# Patient Record
Sex: Female | Born: 1995 | Race: White | Hispanic: No | Marital: Single | Smoking: Never smoker
Health system: Southern US, Community
[De-identification: ages and names within clinical notes are randomized; demographics above are authoritative.]

## PROBLEM LIST (undated history)

## (undated) DIAGNOSIS — F909 Attention-deficit hyperactivity disorder, unspecified type: Secondary | ICD-10-CM

## (undated) HISTORY — PX: TONSILLECTOMY: SUR1361

---
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
## 2018-09-08 ENCOUNTER — Encounter: Payer: Self-pay | Admitting: Neurology

## 2018-09-08 ENCOUNTER — Ambulatory Visit (INDEPENDENT_AMBULATORY_CARE_PROVIDER_SITE_OTHER): Payer: BC Managed Care – PPO | Admitting: Neurology

## 2018-09-08 ENCOUNTER — Other Ambulatory Visit: Payer: Self-pay

## 2018-09-08 VITALS — BP 114/72 | HR 65 | Ht 62.0 in | Wt 126.0 lb

## 2018-09-08 DIAGNOSIS — R251 Tremor, unspecified: Secondary | ICD-10-CM | POA: Diagnosis not present

## 2018-09-08 NOTE — Progress Notes (Signed)
Subjective:    Patient ID: Erica Walsh is a 23 y.o. female.  HPI     Star Age, MD, PhD [facility] [address], [address] [place], [place] [address]  Dear Dr. Dema Severin, I saw your patient, Erica Walsh, upon your kind request in my neurologic clinic today for initial consultation of her tremors.  The patient is accompanied by her father today.  As you know, Erica Walsh is a 23 year old right-handed woman with an underlying medical history of mood disorder, including bipolar disorder by chart review and by patient self-report, ADHD, and allergic rhinitis, who reports an approximately 2-year history of bilateral hand tremors.  With a flareup from time to time, she has difficulty with some fine motor skills at times, she is currently in beauty school.  She has been on Abilify and Lexapro but has not been taking these medications in the past couple of months.  I reviewed your office note from 07/10/2018 which was a telemedicine visit.  She reports not having had any recent blood work for thyroid disease.  She reports no family history of Parkinson's disease or essential tremor.  She has had intermittent numbness affecting the left hand, from the elbow down into the last 2 fingers, this comes and goes.  She has had no one-sided weakness or permanent numbness.  Her tremor is not at rest.  She tries to hydrate well with water.  She does like to drink sweet tea, at least 3 cups/day and drinks coffee in the morning, typically 1 cup, typically no soda.  She denies any significant anxiety or depression currently.  Her mother has 2 first cousins apparently with MS.  Patient denies any symptoms otherwise. Denies any numbness or tingling currently. She uses albuterol intermittently, she does not have a history of asthma but has an inhaler from a prior URI as I understand.  She does not drink alcohol, she quit smoking and uses nicotine vapor off-and-on.  She lives with her mother.  Her  Past Medical History Is Significant For: Past Medical History:  Diagnosis Date  . Attention deficit hyperactivity disorder (ADHD)     Her Past Surgical History Is Significant For: Past Surgical History:  Procedure Laterality Date  . TONSILLECTOMY      Her Family History Is Significant For: History reviewed. No pertinent family history.  Her Social History Is Significant For: Social History   Socioeconomic History  . Marital status: Single    Spouse name: Not on file  . Number of children: Not on file  . Years of education: Not on file  . Highest education level: Not on file  Occupational History  . Not on file  Social Needs  . Financial resource strain: Not on file  . Food insecurity    Worry: Not on file    Inability: Not on file  . Transportation needs    Medical: Not on file    Non-medical: Not on file  Tobacco Use  . Smoking status: Never Smoker  . Smokeless tobacco: Never Used  Substance and Sexual Activity  . Alcohol use: Not on file  . Drug use: Not on file  . Sexual activity: Not on file  Lifestyle  . Physical activity    Days per week: Not on file    Minutes per session: Not on file  . Stress: Not on file  Relationships  . Social Herbalist on phone: Not on file    Gets together: Not  on file    Attends religious service: Not on file    Active member of club or organization: Not on file    Attends meetings of clubs or organizations: Not on file    Relationship status: Not on file  Other Topics Concern  . Not on file  Social History Narrative  . Not on file    Her Allergies Are:  Allergies  Allergen Reactions  . Amoxicillin   . Hydrocodone   . Penicillins   . Tamiflu [Oseltamivir Phosphate] Hives, Itching and Rash  :   Her Current Medications Are:  Outpatient Encounter Medications as of 09/08/2018  Medication Sig  . PRESCRIPTION MEDICATION OCP daily  . ARIPiprazole (ABILIFY) 2 MG tablet Take 2 mg by mouth daily.  Marland Kitchen. escitalopram  (LEXAPRO) 10 MG tablet Take 10 mg by mouth daily.  . [DISCONTINUED] cephALEXin (KEFLEX) 500 MG capsule Take 1 capsule (500 mg total) by mouth 3 (three) times daily.  . [DISCONTINUED] clindamycin (CLEOCIN) 300 MG capsule Take 1 capsule (300 mg total) by mouth every 8 (eight) hours.  . [DISCONTINUED] Efinaconazole (JUBLIA) 10 % SOLN Apply one drop each affected toenail once daily for 12 months   No facility-administered encounter medications on file as of 09/08/2018.   :   Review of Systems:  Out of a complete 14 point review of systems, all are reviewed and negative with the exception of these symptoms as listed below:  Review of Systems  Neurological:       Pt presents today to discuss her bilateral hand tremors. She is also complaining of numbness in her left arm. Pt is right handed.    Objective:  Neurological Exam  Physical Exam Physical Examination:   Vitals:   09/08/18 1018  BP: 114/72  Pulse: 65    General Examination: The patient is a very pleasant 23 y.o. female in no acute distress. She appears well-developed and well-nourished and well groomed.   HEENT: Normocephalic, atraumatic, pupils are equal, round and reactive to light and accommodation. Funduscopic exam is normal with sharp disc margins noted. Extraocular tracking is good without limitation to gaze excursion or nystagmus noted. Normal smooth pursuit is noted. Hearing is grossly intact. Face is symmetric with normal facial animation and normal facial sensation. Speech is clear with no dysarthria noted. There is no hypophonia. There is no lip, neck/head, jaw or voice tremor. Neck is supple with full range of passive and active motion. There are no carotid bruits on auscultation. Oropharynx exam reveals: mild mouth dryness, adequate dental hygiene and no significant airway crowding, tongue protrudes centrally and palate elevates symmetrically.  Chest: Clear to auscultation without wheezing, rhonchi or crackles  noted.  Heart: S1+S2+0, regular and normal without murmurs, rubs or gallops noted.   Abdomen: Soft, non-tender and non-distended with normal bowel sounds appreciated on auscultation.  Extremities: There is no pitting edema in the distal lower extremities bilaterally. Pedal pulses are intact.  Skin: Warm and dry without trophic changes noted.  Musculoskeletal: exam reveals no obvious joint deformities, tenderness or joint swelling or erythema.   Neurologically:  Mental status: The patient is awake, alert and oriented in all 4 spheres. Her immediate and remote memory, attention, language skills and fund of knowledge are appropriate. There is no evidence of aphasia, agnosia, apraxia or anomia. Speech is clear with normal prosody and enunciation. Thought process is linear. Mood is normal and affect is constricted.  Cranial nerves II - XII are as described above under HEENT exam. In addition:  shoulder shrug is normal with equal shoulder height noted. Motor exam:   On 09/08/2018: On Archimedes spiral drawing she has no significant tremor on the right side, slight insecurity with her left side which is her nondominant hand, handwriting is not tremulous, legible, not micrographic.  She has a very slight and intermittent tremor in both upper extremities with posture, right side a little more noticeable than left.  She has no significant action tremor, no resting tremor, no intention tremor.  Normal bulk, strength and tone is noted. There is no drift, tremor or rebound. Romberg is negative. Reflexes are 2+ throughout. Babinski: Toes are flexor bilaterally. Fine motor skills and coordination: intact with normal finger taps, normal hand movements, normal rapid alternating patting, normal foot taps and normal foot agility.  Cerebellar testing: No dysmetria or intention tremor on finger to nose testing. Heel to shin is unremarkable bilaterally. There is no truncal or gait ataxia. dSensory exam: intact to  light touch in the upper and lower extremities.  Gait, station and balance: She stands easily. No veering to one side is noted. No leaning to one side is noted. Posture is age-appropriate and stance is narrow based. Gait shows normal stride length and normal pace. No problems turning are noted. Tandem walk is unremarkable.   Assessment and Plan:   In summary, Erica Walsh is a very pleasant 23 y.o.-year old female with an underlying medical history of mood disorder, including bipolar disorder by chart review and by patient self-report, ADHD, and allergic rhinitis, who Presents for evaluation of her hand tremors. On examination, she has a very mild tremor with posture only, no significant other tremor, no parkinsonian signs, history not suggestive of essential tremor either.  She is largely reassured.  She denies any numbness currently and may have intermittent symptoms with ulnar compression, given the description of her numbness and tingling.  Thankfully, she has no other complaints and her examination is unremarkable otherwise and she is largely reassured.  I suggested we check her thyroid screening test, TSH.  In addition we will check a CMP.  We will keep her posted as to her blood test results.  We talked about tremor triggers.  These can include inhalers, certain antidepressants, but also anxiety, caffeine, sleep deprivation, dehydration.  She is reminded to be well rested and well-hydrated.  I did not suggest any new medications from my end of things at this time and we can monitor her symptoms.  I answered all their questions today and the patient and her father were in agreement. Thank you very much for allowing me to participate in the care of this nice patient. If I can be of any further assistance to you please do not hesitate to call me at [phone].  Sincerely,   Huston FoleySaima Yarethzy Croak, MD, PhD

## 2018-09-08 NOTE — Patient Instructions (Signed)
You have a rather mild and intermittent tremor of both hands.  I do not see any signs or symptoms of parkinson's like disease or what we call parkinsonism. Your history Does not suggest a hereditary tremor or what we call essential tremor.  I would not recommend any medication at this time  Please remember, that any kind of tremor may be exacerbated by anxiety, anger, nervousness, excitement, dehydration, sleep deprivation, by caffeine, and low blood sugar values or blood sugar fluctuations. Some medications can exacerbate tremors, this includes Certain asthma inhalers, antidepressant medications such as SSRI type medications. Please try to limit your caffeine to 2 servings per day and stay well-hydrated with water and well rested.  Try to get 7 to 8 hours of sleep at night.   We can monitor your symptoms, your exam is otherwise good today. I can see you back as needed. We will check blood work for kidney function, liver function, electrolytes and thyroid dysfunction. We will keep you posted as to your blood test result by phone call.

## 2018-09-09 ENCOUNTER — Telehealth: Payer: Self-pay

## 2018-09-09 LAB — COMPREHENSIVE METABOLIC PANEL
ALT: 20 IU/L (ref 0–32)
AST: 16 IU/L (ref 0–40)
Albumin/Globulin Ratio: 2.3 — ABNORMAL HIGH (ref 1.2–2.2)
Albumin: 5 g/dL (ref 3.9–5.0)
Alkaline Phosphatase: 57 IU/L (ref 39–117)
BUN/Creatinine Ratio: 11 (ref 9–23)
BUN: 9 mg/dL (ref 6–20)
Bilirubin Total: 0.5 mg/dL (ref 0.0–1.2)
CO2: 23 mmol/L (ref 20–29)
Calcium: 9.9 mg/dL (ref 8.7–10.2)
Chloride: 100 mmol/L (ref 96–106)
Creatinine, Ser: 0.83 mg/dL (ref 0.57–1.00)
GFR calc Af Amer: 117 mL/min/{1.73_m2} (ref >59)
GFR calc non Af Amer: 102 mL/min/{1.73_m2} (ref >59)
Globulin, Total: 2.2 g/dL (ref 1.5–4.5)
Glucose: 80 mg/dL (ref 65–99)
Potassium: 4.4 mmol/L (ref 3.5–5.2)
Sodium: 139 mmol/L (ref 134–144)
Total Protein: 7.2 g/dL (ref 6.0–8.5)

## 2018-09-09 LAB — TSH: TSH: 0.869 u[IU]/mL (ref 0.450–4.500)

## 2018-09-09 NOTE — Telephone Encounter (Signed)
-----   Message from Star Age, MD sent at 09/09/2018 11:37 AM EDT ----- Labs are fine, pls update patient.

## 2018-09-09 NOTE — Telephone Encounter (Signed)
I called pt. I advised her of the lab results. Pt verbalized understanding of results. Pt had no questions at this time but was encouraged to call back if questions arise.

## 2018-09-09 NOTE — Progress Notes (Signed)
Labs are fine, pls update patient.

## 2019-06-01 DIAGNOSIS — Z0000 Encounter for general adult medical examination without abnormal findings: Secondary | ICD-10-CM | POA: Diagnosis not present

## 2019-06-01 DIAGNOSIS — R634 Abnormal weight loss: Secondary | ICD-10-CM | POA: Diagnosis not present

## 2019-06-24 DIAGNOSIS — Z20828 Contact with and (suspected) exposure to other viral communicable diseases: Secondary | ICD-10-CM | POA: Diagnosis not present

## 2019-06-24 DIAGNOSIS — J019 Acute sinusitis, unspecified: Secondary | ICD-10-CM | POA: Diagnosis not present

## 2019-06-24 DIAGNOSIS — J209 Acute bronchitis, unspecified: Secondary | ICD-10-CM | POA: Diagnosis not present

## 2019-08-31 ENCOUNTER — Ambulatory Visit
Admission: RE | Admit: 2019-08-31 | Discharge: 2019-08-31 | Disposition: A | Discharge status: Home / Self Care | Payer: BLUE CROSS/BLUE SHIELD | Source: Ambulatory Visit | Attending: Physician Assistant | Admitting: Physician Assistant

## 2019-08-31 ENCOUNTER — Other Ambulatory Visit: Payer: Self-pay

## 2019-08-31 ENCOUNTER — Other Ambulatory Visit: Payer: Self-pay | Admitting: Physician Assistant

## 2019-08-31 DIAGNOSIS — S6991XA Unspecified injury of right wrist, hand and finger(s), initial encounter: Secondary | ICD-10-CM

## 2019-08-31 DIAGNOSIS — M79641 Pain in right hand: Secondary | ICD-10-CM | POA: Diagnosis not present

## 2019-09-01 DIAGNOSIS — S62656D Nondisplaced fracture of medial phalanx of right little finger, subsequent encounter for fracture with routine healing: Secondary | ICD-10-CM | POA: Diagnosis not present

## 2019-09-01 DIAGNOSIS — F39 Unspecified mood [affective] disorder: Secondary | ICD-10-CM | POA: Diagnosis not present

## 2019-09-01 DIAGNOSIS — M79641 Pain in right hand: Secondary | ICD-10-CM | POA: Diagnosis not present

## 2019-09-23 DIAGNOSIS — Z01419 Encounter for gynecological examination (general) (routine) without abnormal findings: Secondary | ICD-10-CM | POA: Diagnosis not present

## 2019-09-23 DIAGNOSIS — Z789 Other specified health status: Secondary | ICD-10-CM | POA: Diagnosis not present

## 2019-09-23 DIAGNOSIS — N898 Other specified noninflammatory disorders of vagina: Secondary | ICD-10-CM | POA: Diagnosis not present

## 2020-03-26 DIAGNOSIS — M791 Myalgia, unspecified site: Secondary | ICD-10-CM | POA: Diagnosis not present

## 2020-03-26 DIAGNOSIS — R051 Acute cough: Secondary | ICD-10-CM | POA: Diagnosis not present

## 2020-03-26 DIAGNOSIS — R509 Fever, unspecified: Secondary | ICD-10-CM | POA: Diagnosis not present

## 2020-03-26 DIAGNOSIS — Z20828 Contact with and (suspected) exposure to other viral communicable diseases: Secondary | ICD-10-CM | POA: Diagnosis not present

## 2020-06-13 DIAGNOSIS — E441 Mild protein-calorie malnutrition: Secondary | ICD-10-CM | POA: Diagnosis not present

## 2020-06-13 DIAGNOSIS — F39 Unspecified mood [affective] disorder: Secondary | ICD-10-CM | POA: Diagnosis not present

## 2020-10-31 DIAGNOSIS — R112 Nausea with vomiting, unspecified: Secondary | ICD-10-CM | POA: Diagnosis not present

## 2021-01-08 IMAGING — CR DG HAND COMPLETE 3+V*R*
3 series · 3 of 3 positions shown · non-contrast
Comparison: None.

CLINICAL DATA: Right hand pain after injury. Initial encounter.
Injured fifth finger 4 days ago with pain and bruising.

EXAM:
RIGHT HAND - COMPLETE 3+ VIEW

[x hand pa right]
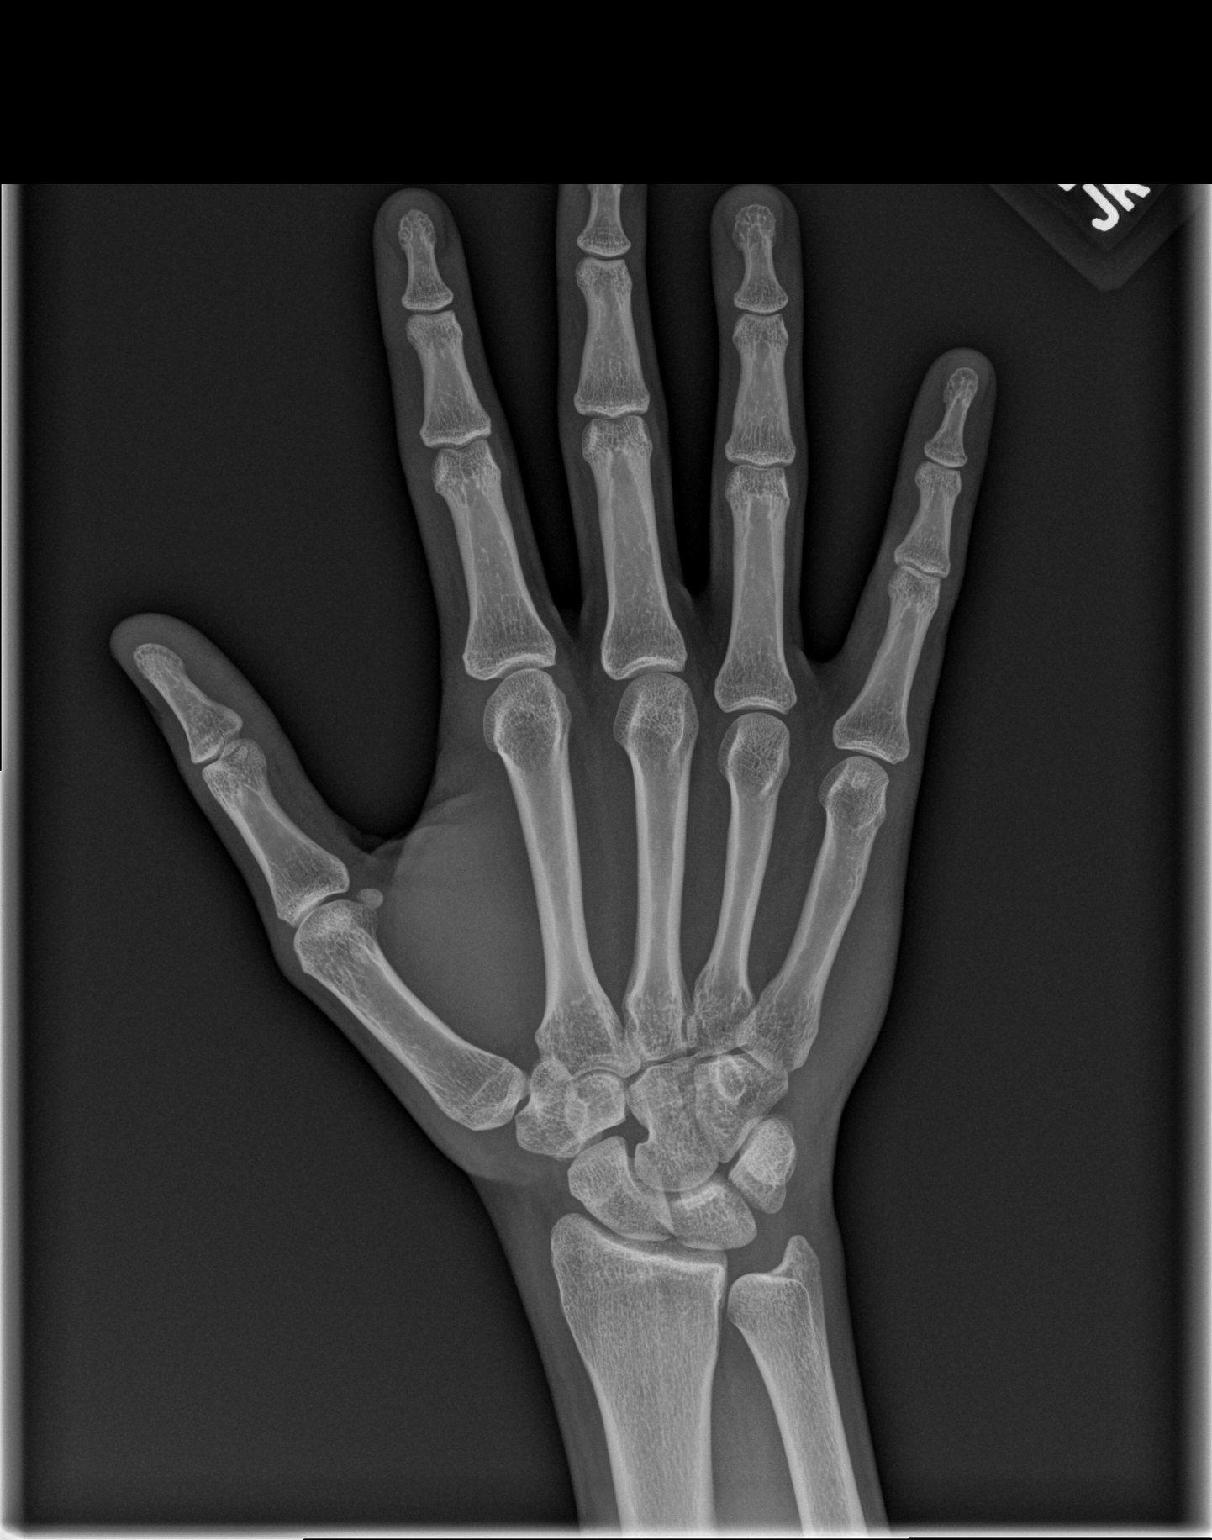

[x hand obl right]
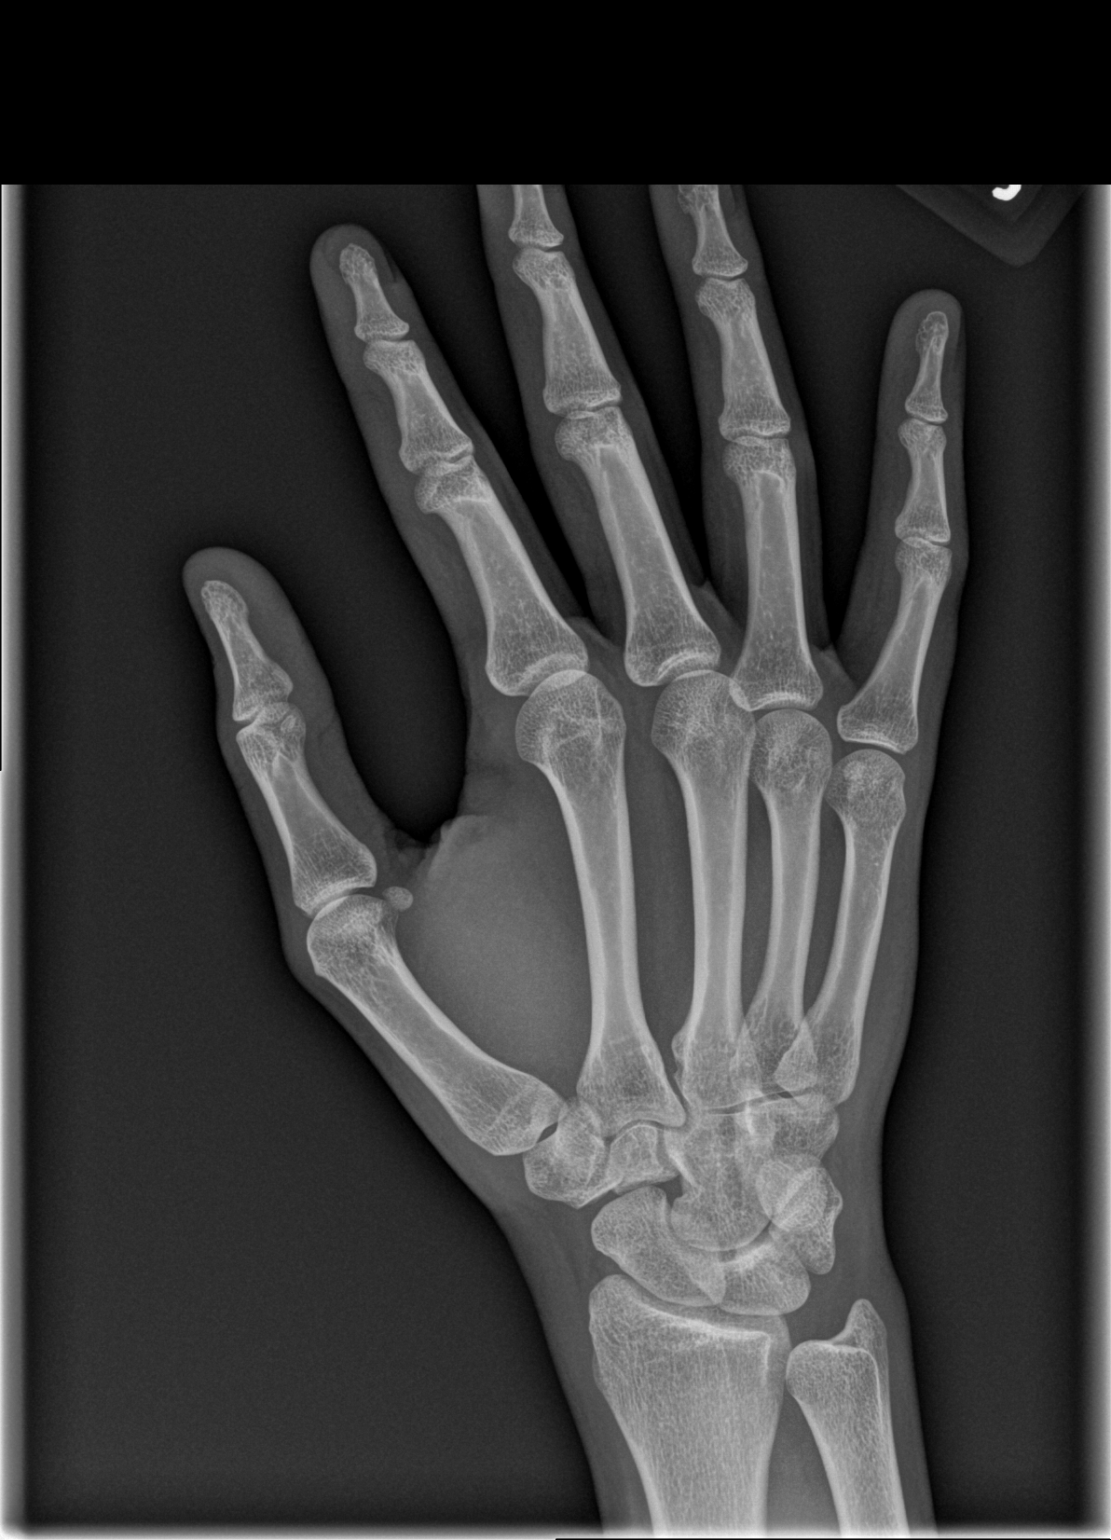

[x hand lat right]
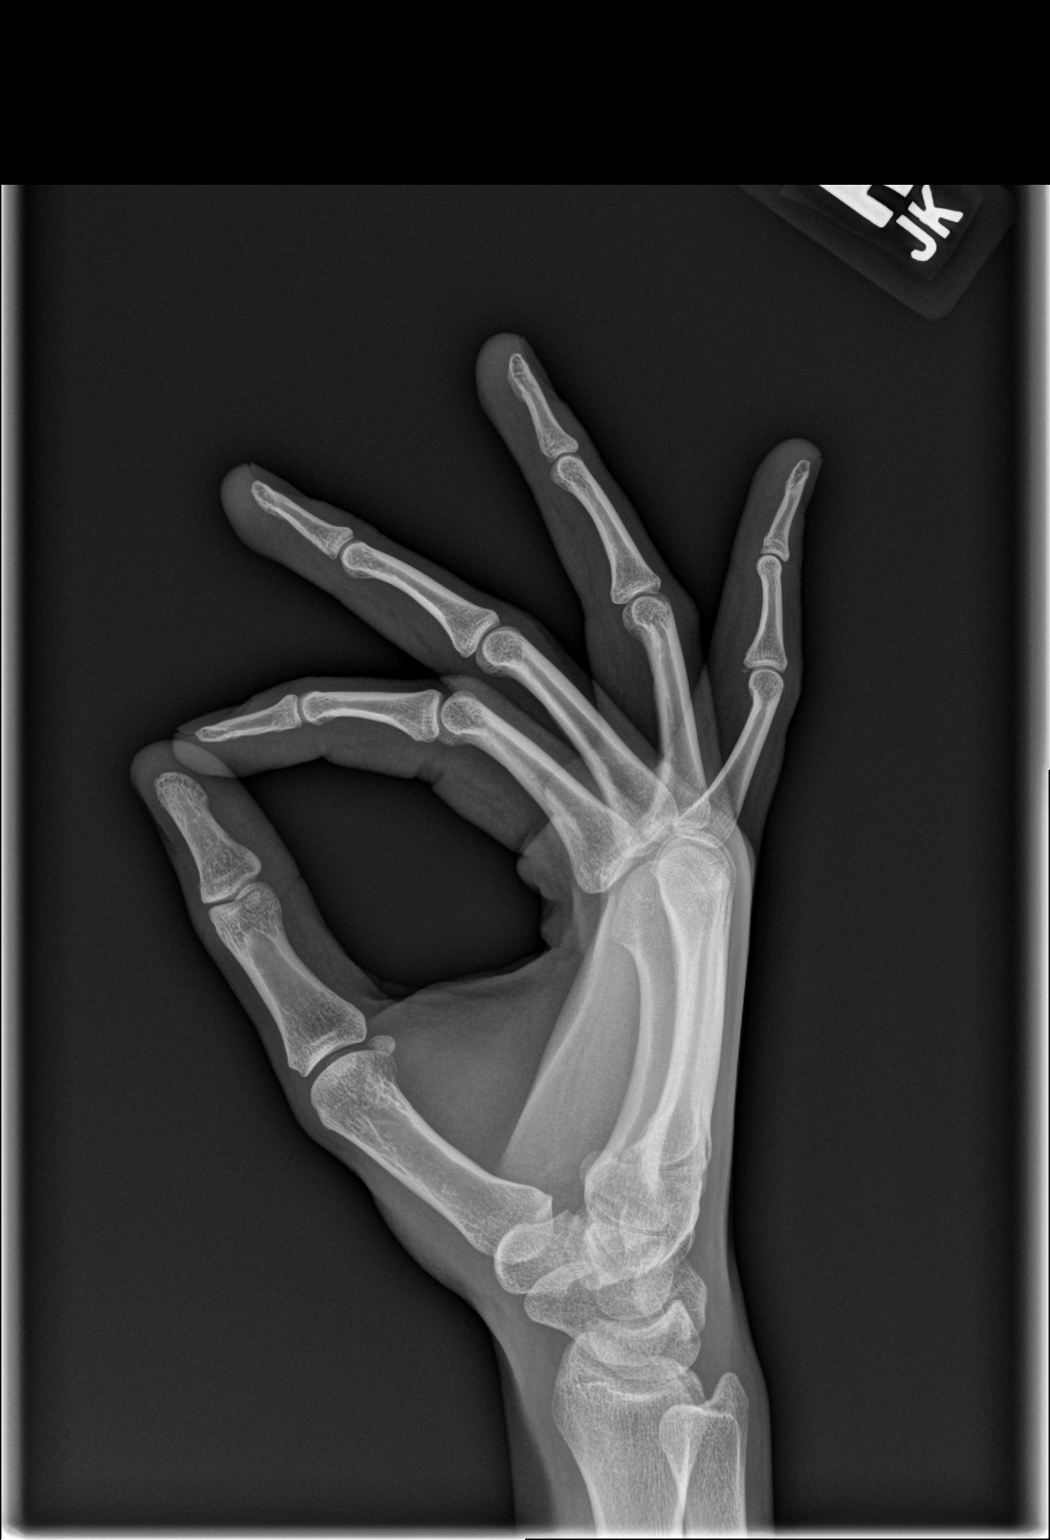

[3 of 3 positions shown; findings below may reference images not displayed]

FINDINGS: Tiny volar plate fracture from the fifth digit middle phalanx at the
proximal interphalangeal joint. No additional fracture of the hand.
Normal joint spaces and alignment. Minimal generalized fifth digit
edema.
IMPRESSION: Tiny volar plate fracture from the fifth digit middle phalanx at the
proximal interphalangeal joint.

## 2021-01-12 DIAGNOSIS — R1110 Vomiting, unspecified: Secondary | ICD-10-CM | POA: Diagnosis not present

## 2021-01-12 DIAGNOSIS — J09X2 Influenza due to identified novel influenza A virus with other respiratory manifestations: Secondary | ICD-10-CM | POA: Diagnosis not present

## 2021-01-12 DIAGNOSIS — R509 Fever, unspecified: Secondary | ICD-10-CM | POA: Diagnosis not present

## 2021-01-12 DIAGNOSIS — Z20828 Contact with and (suspected) exposure to other viral communicable diseases: Secondary | ICD-10-CM | POA: Diagnosis not present

## 2021-01-12 DIAGNOSIS — R6883 Chills (without fever): Secondary | ICD-10-CM | POA: Diagnosis not present

## 2021-03-20 DIAGNOSIS — Z20822 Contact with and (suspected) exposure to covid-19: Secondary | ICD-10-CM | POA: Diagnosis not present

## 2021-03-20 DIAGNOSIS — R112 Nausea with vomiting, unspecified: Secondary | ICD-10-CM | POA: Diagnosis not present

## 2021-03-20 DIAGNOSIS — K529 Noninfective gastroenteritis and colitis, unspecified: Secondary | ICD-10-CM | POA: Diagnosis not present

## 2021-04-20 DIAGNOSIS — Z113 Encounter for screening for infections with a predominantly sexual mode of transmission: Secondary | ICD-10-CM | POA: Diagnosis not present

## 2021-04-20 DIAGNOSIS — Z01419 Encounter for gynecological examination (general) (routine) without abnormal findings: Secondary | ICD-10-CM | POA: Diagnosis not present

## 2021-04-24 DIAGNOSIS — Z3202 Encounter for pregnancy test, result negative: Secondary | ICD-10-CM | POA: Diagnosis not present

## 2021-04-24 DIAGNOSIS — Z30017 Encounter for initial prescription of implantable subdermal contraceptive: Secondary | ICD-10-CM | POA: Diagnosis not present

## 2021-07-21 ENCOUNTER — Ambulatory Visit: Payer: BC Managed Care – PPO | Admitting: Podiatry

## 2021-07-27 ENCOUNTER — Encounter: Payer: Self-pay | Admitting: Podiatry

## 2021-07-27 ENCOUNTER — Ambulatory Visit: Payer: BC Managed Care – PPO | Admitting: Podiatry

## 2021-07-27 DIAGNOSIS — L600 Ingrowing nail: Secondary | ICD-10-CM

## 2021-07-27 MED ORDER — NEOMYCIN-POLYMYXIN-HC 3.5-10000-1 OT SOLN: OTIC | 1 refills | Status: AC

## 2021-07-27 NOTE — Progress Notes (Signed)
Subjective:   Patient ID: Erica Walsh, female   DOB: 26 y.o.   MRN: [id number]   HPI Patient presents with father with chronic ingrown toenails of the big toes of both feet.  States they get very tender right over left had left one done 8 years ago but gradually it is reoccurred as far as just the overall nail structure.  Patient states they are very sore and she tries pedicures and she does not smoke tries to be active   Review of Systems  All other systems reviewed and are negative.      Objective:  Physical Exam Vitals and nursing note reviewed.  Constitutional:      Appearance: She is well-developed.  Pulmonary:     Effort: Pulmonary effort is normal.  Musculoskeletal:        General: Normal range of motion.  Skin:    General: Skin is warm.  Neurological:     Mental Status: She is alert.    Neurovascular status found to be intact muscle strength found to be adequate range of motion within normal limits.  Patient is found have incurvation of the medial border of the hallux bilateral with redness and tenderness with pressure and no active drainage no erythema surrounding the area.  Patient does have a history of vasovagal reaction.  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  I have recommended correction of deformity I explained procedure risk and they want to have this done and I explained what the surgical correction will be.  Patient is amendable to surgery and signed consent form and I went ahead I infiltrated each hallux 60 mg like Marcaine mixture patient had a vasovagal reaction we lowered her down and she responded well to this and then brought her back up again.  I did sterile prep of the big toes and using sterile instrumentation remove the medial borders of each hallux exposed matrix applied phenol 3 applications 30  seconds followed by alcohol lavage sterile dressing gave instructions on soaks and bandage usage and leave dressings on 24 hours but take them off earlier if throbbing were to occur encouraged them to call questions concerns and also wrote a prescription for topical Neosporin treatment

## 2021-07-27 NOTE — Patient Instructions (Signed)

## 2021-08-03 DIAGNOSIS — D229 Melanocytic nevi, unspecified: Secondary | ICD-10-CM | POA: Diagnosis not present

## 2021-08-03 DIAGNOSIS — H01119 Allergic dermatitis of unspecified eye, unspecified eyelid: Secondary | ICD-10-CM | POA: Diagnosis not present

## 2021-08-03 DIAGNOSIS — Q825 Congenital non-neoplastic nevus: Secondary | ICD-10-CM | POA: Diagnosis not present

## 2021-08-15 DIAGNOSIS — R519 Headache, unspecified: Secondary | ICD-10-CM | POA: Diagnosis not present

## 2021-08-15 DIAGNOSIS — Z20822 Contact with and (suspected) exposure to covid-19: Secondary | ICD-10-CM | POA: Diagnosis not present

## 2021-08-15 DIAGNOSIS — R059 Cough, unspecified: Secondary | ICD-10-CM | POA: Diagnosis not present

## 2021-08-15 DIAGNOSIS — J069 Acute upper respiratory infection, unspecified: Secondary | ICD-10-CM | POA: Diagnosis not present

## 2021-08-16 DIAGNOSIS — J019 Acute sinusitis, unspecified: Secondary | ICD-10-CM | POA: Diagnosis not present

## 2021-12-09 DIAGNOSIS — R509 Fever, unspecified: Secondary | ICD-10-CM | POA: Diagnosis not present

## 2021-12-09 DIAGNOSIS — J029 Acute pharyngitis, unspecified: Secondary | ICD-10-CM | POA: Diagnosis not present

## 2021-12-09 DIAGNOSIS — J069 Acute upper respiratory infection, unspecified: Secondary | ICD-10-CM | POA: Diagnosis not present

## 2021-12-09 DIAGNOSIS — R0981 Nasal congestion: Secondary | ICD-10-CM | POA: Diagnosis not present

## 2021-12-09 DIAGNOSIS — R051 Acute cough: Secondary | ICD-10-CM | POA: Diagnosis not present

## 2021-12-09 DIAGNOSIS — R519 Headache, unspecified: Secondary | ICD-10-CM | POA: Diagnosis not present

## 2022-03-01 DIAGNOSIS — F39 Unspecified mood [affective] disorder: Secondary | ICD-10-CM | POA: Diagnosis not present

## 2022-03-29 DIAGNOSIS — F4310 Post-traumatic stress disorder, unspecified: Secondary | ICD-10-CM | POA: Diagnosis not present

## 2022-04-10 DIAGNOSIS — F4310 Post-traumatic stress disorder, unspecified: Secondary | ICD-10-CM | POA: Diagnosis not present

## 2022-05-04 DIAGNOSIS — F4310 Post-traumatic stress disorder, unspecified: Secondary | ICD-10-CM | POA: Diagnosis not present

## 2022-05-08 DIAGNOSIS — F4310 Post-traumatic stress disorder, unspecified: Secondary | ICD-10-CM | POA: Diagnosis not present

## 2022-05-17 DIAGNOSIS — F4310 Post-traumatic stress disorder, unspecified: Secondary | ICD-10-CM | POA: Diagnosis not present

## 2022-05-24 DIAGNOSIS — F4310 Post-traumatic stress disorder, unspecified: Secondary | ICD-10-CM | POA: Diagnosis not present

## 2022-05-31 DIAGNOSIS — F4310 Post-traumatic stress disorder, unspecified: Secondary | ICD-10-CM | POA: Diagnosis not present

## 2022-06-07 DIAGNOSIS — F4310 Post-traumatic stress disorder, unspecified: Secondary | ICD-10-CM | POA: Diagnosis not present

## 2022-06-14 DIAGNOSIS — F4310 Post-traumatic stress disorder, unspecified: Secondary | ICD-10-CM | POA: Diagnosis not present

## 2022-06-21 DIAGNOSIS — F4310 Post-traumatic stress disorder, unspecified: Secondary | ICD-10-CM | POA: Diagnosis not present

## 2022-07-12 DIAGNOSIS — F4310 Post-traumatic stress disorder, unspecified: Secondary | ICD-10-CM | POA: Diagnosis not present

## 2022-07-18 DIAGNOSIS — F4310 Post-traumatic stress disorder, unspecified: Secondary | ICD-10-CM | POA: Diagnosis not present

## 2022-07-24 DIAGNOSIS — F4310 Post-traumatic stress disorder, unspecified: Secondary | ICD-10-CM | POA: Diagnosis not present

## 2022-08-01 DIAGNOSIS — F4310 Post-traumatic stress disorder, unspecified: Secondary | ICD-10-CM | POA: Diagnosis not present

## 2022-08-22 DIAGNOSIS — F4310 Post-traumatic stress disorder, unspecified: Secondary | ICD-10-CM | POA: Diagnosis not present

## 2022-09-18 DIAGNOSIS — F4310 Post-traumatic stress disorder, unspecified: Secondary | ICD-10-CM | POA: Diagnosis not present

## 2022-10-25 DIAGNOSIS — F4310 Post-traumatic stress disorder, unspecified: Secondary | ICD-10-CM | POA: Diagnosis not present

## 2022-11-21 ENCOUNTER — Other Ambulatory Visit (HOSPITAL_COMMUNITY)
Admission: RE | Admit: 2022-11-21 | Discharge: 2022-11-21 | Disposition: A | Discharge status: Home / Self Care | Payer: BC Managed Care – PPO | Source: Ambulatory Visit | Attending: Nurse Practitioner | Admitting: Nurse Practitioner

## 2022-11-21 ENCOUNTER — Other Ambulatory Visit: Payer: Self-pay | Admitting: Nurse Practitioner

## 2022-11-21 DIAGNOSIS — Z124 Encounter for screening for malignant neoplasm of cervix: Secondary | ICD-10-CM | POA: Diagnosis not present

## 2022-11-21 DIAGNOSIS — Z01419 Encounter for gynecological examination (general) (routine) without abnormal findings: Secondary | ICD-10-CM | POA: Diagnosis not present

## 2022-11-30 LAB — CYTOLOGY - PAP: Diagnosis: NEGATIVE

## 2023-04-12 DIAGNOSIS — K625 Hemorrhage of anus and rectum: Secondary | ICD-10-CM | POA: Diagnosis not present

## 2023-04-12 DIAGNOSIS — K602 Anal fissure, unspecified: Secondary | ICD-10-CM | POA: Diagnosis not present

## 2023-04-12 DIAGNOSIS — F39 Unspecified mood [affective] disorder: Secondary | ICD-10-CM | POA: Diagnosis not present

## 2023-10-15 DIAGNOSIS — H6502 Acute serous otitis media, left ear: Secondary | ICD-10-CM | POA: Diagnosis not present

## 2023-10-20 DIAGNOSIS — H65192 Other acute nonsuppurative otitis media, left ear: Secondary | ICD-10-CM | POA: Diagnosis not present
